# Patient Record
Sex: Female | Born: 1995 | Race: White | Hispanic: No | Marital: Single | Smoking: Current every day smoker
Health system: Southern US, Community
[De-identification: ages and names within clinical notes are randomized; demographics above are authoritative.]

## PROBLEM LIST (undated history)

## (undated) DIAGNOSIS — R011 Cardiac murmur, unspecified: Secondary | ICD-10-CM

## (undated) HISTORY — PX: CATARACT PEDIATRIC: SHX6289

---
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
## 2017-10-31 ENCOUNTER — Emergency Department (HOSPITAL_BASED_OUTPATIENT_CLINIC_OR_DEPARTMENT_OTHER): Payer: BLUE CROSS/BLUE SHIELD

## 2017-10-31 ENCOUNTER — Other Ambulatory Visit: Payer: Self-pay

## 2017-10-31 ENCOUNTER — Encounter (HOSPITAL_BASED_OUTPATIENT_CLINIC_OR_DEPARTMENT_OTHER): Payer: Self-pay | Admitting: Emergency Medicine

## 2017-10-31 ENCOUNTER — Emergency Department (HOSPITAL_BASED_OUTPATIENT_CLINIC_OR_DEPARTMENT_OTHER)
Admission: EM | Admit: 2017-10-31 | Discharge: 2017-10-31 | Disposition: A | Discharge status: Home / Self Care | Payer: BLUE CROSS/BLUE SHIELD | Attending: Emergency Medicine | Admitting: Emergency Medicine

## 2017-10-31 DIAGNOSIS — S81011A Laceration without foreign body, right knee, initial encounter: Secondary | ICD-10-CM | POA: Diagnosis not present

## 2017-10-31 DIAGNOSIS — Z79899 Other long term (current) drug therapy: Secondary | ICD-10-CM | POA: Insufficient documentation

## 2017-10-31 DIAGNOSIS — Y999 Unspecified external cause status: Secondary | ICD-10-CM | POA: Insufficient documentation

## 2017-10-31 DIAGNOSIS — Y92410 Unspecified street and highway as the place of occurrence of the external cause: Secondary | ICD-10-CM | POA: Diagnosis not present

## 2017-10-31 DIAGNOSIS — F17210 Nicotine dependence, cigarettes, uncomplicated: Secondary | ICD-10-CM | POA: Diagnosis not present

## 2017-10-31 DIAGNOSIS — M25532 Pain in left wrist: Secondary | ICD-10-CM | POA: Diagnosis not present

## 2017-10-31 DIAGNOSIS — Y9389 Activity, other specified: Secondary | ICD-10-CM | POA: Diagnosis not present

## 2017-10-31 HISTORY — DX: Cardiac murmur, unspecified: R01.1

## 2017-10-31 MED ORDER — CEPHALEXIN 500 MG PO CAPS
500.0000 mg | ORAL_CAPSULE | Freq: Four times a day (QID) | ORAL | 0 refills | Status: DC
Start: 2017-10-31 — End: 2018-01-07

## 2017-10-31 MED ORDER — LIDOCAINE-EPINEPHRINE (PF) 2 %-1:200000 IJ SOLN
10.0000 mL | Freq: Once | INTRAMUSCULAR | Status: AC
  Administered 2017-10-31: 10 mL
  Filled 2017-10-31 (×2): qty 10

## 2017-10-31 MED ORDER — HYDROCODONE-ACETAMINOPHEN 5-325 MG PO TABS
2.0000 | ORAL_TABLET | Freq: Once | ORAL | Status: AC
  Administered 2017-10-31: 2 via ORAL
  Filled 2017-10-31: qty 2

## 2017-10-31 MED ORDER — HYDROCODONE-ACETAMINOPHEN 5-325 MG PO TABS: 1.0000 | ORAL_TABLET | ORAL | 0 refills | Status: DC | PRN

## 2017-10-31 MED ORDER — LIDOCAINE-EPINEPHRINE (PF) 2 %-1:200000 IJ SOLN
20.0000 mL | Freq: Once | INTRAMUSCULAR | Status: DC
  Filled 2017-10-31: qty 20

## 2017-10-31 MED ORDER — IBUPROFEN 400 MG PO TABS
600.0000 mg | ORAL_TABLET | Freq: Once | ORAL | Status: AC
  Administered 2017-10-31: 600 mg via ORAL
  Filled 2017-10-31: qty 1

## 2017-10-31 MED FILL — CEPHALEXIN 500 MG CAPSULE: 500 | 7 days supply | Qty: 28 | Fill #0

## 2017-10-31 MED FILL — HYDROCODON-APAP 5-325: 5-325 | 1 days supply | Qty: 6 | Fill #0
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
## 2017-10-31 NOTE — ED Notes (Signed)
Pt/family verbalized understanding of discharge instructions.   
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
## 2017-10-31 NOTE — Discharge Instructions (Addendum)
Please follow up with Dr. Pearletha Forge, Sports Medicine on Friday 11/02/17 to make sure your right knee wound is healing appropriately. Please watch for signs of infection such as increasing swelling, redness, discharge, and pain. Take your antibiotic as prescribed until you complete the bottle. Ice and elevate your leg. Take tylenol and/or ibuprofen as needed for pain. You can take Norco for severe pain.   If you experience worsening pain, shortness of breath, fevers, chills, nausea, vomiting please see a doctor immediately.   For your left wrist, please wear the splint for the next 1-2 weeks and follow up with Sports Medicine. Your wrist x-ray did not show any fractures but there is a chance you could have a fracture that won't show up on x-ray until weeks later. When you follow up with Sports Medicine, they may decide to get another x-ray and reevaluate for fracture.

## 2017-11-02 ENCOUNTER — Ambulatory Visit (HOSPITAL_BASED_OUTPATIENT_CLINIC_OR_DEPARTMENT_OTHER)
Admission: RE | Admit: 2017-11-02 | Discharge: 2017-11-02 | Disposition: A | Discharge status: Home / Self Care | Payer: BLUE CROSS/BLUE SHIELD | Source: Ambulatory Visit | Attending: Family Medicine | Admitting: Family Medicine

## 2017-11-02 ENCOUNTER — Ambulatory Visit (INDEPENDENT_AMBULATORY_CARE_PROVIDER_SITE_OTHER): Payer: Self-pay | Admitting: Family Medicine

## 2017-11-02 ENCOUNTER — Encounter: Payer: Self-pay | Admitting: Family Medicine

## 2017-11-02 VITALS — BP 162/87 | HR 91 | Ht 67.0 in | Wt 190.0 lb

## 2017-11-02 DIAGNOSIS — S6992XA Unspecified injury of left wrist, hand and finger(s), initial encounter: Secondary | ICD-10-CM

## 2017-11-02 DIAGNOSIS — S8990XA Unspecified injury of unspecified lower leg, initial encounter: Secondary | ICD-10-CM

## 2017-11-02 DIAGNOSIS — M542 Cervicalgia: Secondary | ICD-10-CM

## 2017-11-02 MED ORDER — OXYCODONE-ACETAMINOPHEN 5-325 MG PO TABS: 1.0000 | ORAL_TABLET | Freq: Four times a day (QID) | ORAL | 0 refills | Status: DC | PRN

## 2017-11-02 MED ORDER — METHOCARBAMOL 500 MG PO TABS: 500.0000 mg | ORAL_TABLET | Freq: Three times a day (TID) | ORAL | 1 refills | Status: DC | PRN

## 2017-11-02 NOTE — Patient Instructions (Addendum)
Wear wrist brace at all times except to wash the area. Follow up with me in about 10-12 days for reevaluation and we will remove the stitches, reexamine your wrist.  You have a cervical strain as well of your neck. Ibuprofen 600mg  three times a day with food for pain and inflammation. Robaxin three times a day as needed for muscle spasms. Percocet as needed for severe pain (no driving on this medicine). Topical salon pas patches, capsaicin, and/or aspercreme will help with pain as well. Simple range of motion exercises within limits of pain to prevent further stiffness. Consider physical therapy for stretching, exercises, traction, and modalities. Heat 15 minutes at a time 3-4 times a day to help with spasms. Watch head position when on computers, texting, when sleeping in bed - should in line with back to prevent further spasms. Consider home traction unit if you get benefit with this in physical therapy. If not improving we will consider an MRI. Follow up with me in 10-12 days.

## 2017-11-05 ENCOUNTER — Encounter: Payer: Self-pay | Admitting: Family Medicine
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
## 2017-11-12 ENCOUNTER — Ambulatory Visit (INDEPENDENT_AMBULATORY_CARE_PROVIDER_SITE_OTHER): Payer: Self-pay | Admitting: Family Medicine

## 2017-11-12 ENCOUNTER — Ambulatory Visit (HOSPITAL_BASED_OUTPATIENT_CLINIC_OR_DEPARTMENT_OTHER)
Admission: RE | Admit: 2017-11-12 | Discharge: 2017-11-12 | Disposition: A | Discharge status: Home / Self Care | Payer: BLUE CROSS/BLUE SHIELD | Source: Ambulatory Visit | Attending: Family Medicine | Admitting: Family Medicine

## 2017-11-12 ENCOUNTER — Encounter: Payer: Self-pay | Admitting: Family Medicine

## 2017-11-12 VITALS — BP 106/66 | HR 97 | Ht 68.0 in | Wt 190.0 lb

## 2017-11-12 DIAGNOSIS — M25562 Pain in left knee: Secondary | ICD-10-CM

## 2017-11-12 DIAGNOSIS — M542 Cervicalgia: Secondary | ICD-10-CM

## 2017-11-12 DIAGNOSIS — S6992XD Unspecified injury of left wrist, hand and finger(s), subsequent encounter: Secondary | ICD-10-CM

## 2017-11-12 DIAGNOSIS — M25532 Pain in left wrist: Secondary | ICD-10-CM | POA: Diagnosis not present

## 2017-11-12 DIAGNOSIS — M25561 Pain in right knee: Secondary | ICD-10-CM

## 2017-11-12 NOTE — Patient Instructions (Signed)
You're doing great! If the steri-strips are still on in 2 weeks you can remove these in the shower - if they fall off before then, that's ok - they don't need to be reapplied. Brace only as needed now for your wrist - I'd prefer you try without this now to regain full strength which I think will occur over 2-3 more weeks. Ibuprofen, icing only if needed. Follow up with me in 4 weeks for reevaluation.
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
## 2017-11-13 ENCOUNTER — Encounter: Payer: Self-pay | Admitting: Family Medicine

## 2017-11-14 ENCOUNTER — Encounter: Payer: Self-pay | Admitting: Family Medicine

## 2017-12-10 ENCOUNTER — Encounter: Payer: Self-pay | Admitting: Family Medicine

## 2017-12-10 ENCOUNTER — Ambulatory Visit: Payer: Self-pay

## 2017-12-10 ENCOUNTER — Ambulatory Visit (INDEPENDENT_AMBULATORY_CARE_PROVIDER_SITE_OTHER): Payer: Self-pay | Admitting: Family Medicine

## 2017-12-10 VITALS — BP 146/83 | HR 86 | Ht 67.0 in | Wt 190.0 lb

## 2017-12-10 DIAGNOSIS — S6992XD Unspecified injury of left wrist, hand and finger(s), subsequent encounter: Secondary | ICD-10-CM

## 2017-12-10 NOTE — Patient Instructions (Signed)
You have a healed base of the 3rd metacarpal fracture and a nearly completely healed base 2nd metacarpal fracture. Continue with wrist brace for at least 2 more weeks - if still sore, wear until I see you back in 4 weeks. Icing 15 minutes at a time 3-4 times a day. Tylenol, ibuprofen only if needed. Follow up with me in 4 weeks for reevaluation.
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
## 2018-01-07 ENCOUNTER — Encounter: Payer: Self-pay | Admitting: Family Medicine

## 2018-01-07 ENCOUNTER — Ambulatory Visit (INDEPENDENT_AMBULATORY_CARE_PROVIDER_SITE_OTHER): Payer: Self-pay | Admitting: Family Medicine

## 2018-01-07 VITALS — BP 147/71 | HR 78 | Ht 67.0 in | Wt 190.0 lb

## 2018-01-07 DIAGNOSIS — M25562 Pain in left knee: Secondary | ICD-10-CM

## 2018-01-07 DIAGNOSIS — M25561 Pain in right knee: Secondary | ICD-10-CM

## 2018-01-07 DIAGNOSIS — S6992XD Unspecified injury of left wrist, hand and finger(s), subsequent encounter: Secondary | ICD-10-CM
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
## 2018-01-07 NOTE — Patient Instructions (Signed)
You're doing great! Call me if you need anything otherwise follow up with me as needed. 
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
